# Patient Record
Sex: Male | Born: 1960 | Race: White | Hispanic: No | Marital: Single
Health system: Southern US, Community
[De-identification: ages and names within clinical notes are randomized; demographics above are authoritative.]

---

## 2008-08-22 ENCOUNTER — Encounter: Admission: RE | Admit: 2008-08-22 | Discharge: 2008-08-22 | Payer: Self-pay | Admitting: Emergency Medicine

## 2010-02-15 ENCOUNTER — Encounter: Payer: Self-pay | Admitting: Emergency Medicine
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
## 2019-06-20 ENCOUNTER — Ambulatory Visit: Payer: Self-pay | Attending: Internal Medicine

## 2019-06-20 DIAGNOSIS — Z23 Encounter for immunization: Secondary | ICD-10-CM

## 2019-06-20 NOTE — Progress Notes (Signed)
   [facility]  Name:  Thurmon Mizell    MRN: [id number] DOB: 1960/04/09  06/20/2019  Mr. Braddy was observed post Covid-19 immunization for 15 minutes without incident. He was provided with Vaccine Information Sheet and instruction to access the V-Safe system.   Mr. Perkey was instructed to call [phone] with any severe reactions post vaccine: Marland Kitchen Difficulty breathing  . Swelling of face and throat  . A fast heartbeat  . A bad rash all over body  . Dizziness and weakness   Immunizations Administered    Name Date Dose VIS Date Route   JANSSEN COVID-19 VACCINE 06/20/2019  1:25 PM 0.5 mL 03/24/2019 Intramuscular   Manufacturer: [employer]   Lot: [id number]   NDC: 14970-263-78
